# Patient Record
Sex: Male | Born: 1965 | Race: Black or African American | Hispanic: No | Marital: Married | Smoking: Never smoker
Health system: Southern US, Community
[De-identification: ages and names within clinical notes are randomized; demographics above are authoritative.]

## PROBLEM LIST (undated history)

## (undated) DIAGNOSIS — I2510 Atherosclerotic heart disease of native coronary artery without angina pectoris: Secondary | ICD-10-CM

## (undated) DIAGNOSIS — J449 Chronic obstructive pulmonary disease, unspecified: Secondary | ICD-10-CM

## (undated) DIAGNOSIS — I10 Essential (primary) hypertension: Secondary | ICD-10-CM

## (undated) DIAGNOSIS — E119 Type 2 diabetes mellitus without complications: Secondary | ICD-10-CM

## (undated) HISTORY — PX: BACK SURGERY: SHX140

## (undated) HISTORY — PX: NECK SURGERY: SHX720

---

## 2015-05-09 ENCOUNTER — Encounter (HOSPITAL_BASED_OUTPATIENT_CLINIC_OR_DEPARTMENT_OTHER): Payer: Self-pay

## 2015-05-09 ENCOUNTER — Emergency Department (HOSPITAL_BASED_OUTPATIENT_CLINIC_OR_DEPARTMENT_OTHER)
Admission: EM | Admit: 2015-05-09 | Discharge: 2015-05-10 | Disposition: A | Discharge status: Home / Self Care | Payer: Self-pay | Attending: Emergency Medicine | Admitting: Emergency Medicine

## 2015-05-09 ENCOUNTER — Emergency Department (HOSPITAL_BASED_OUTPATIENT_CLINIC_OR_DEPARTMENT_OTHER): Payer: Self-pay

## 2015-05-09 DIAGNOSIS — Z79899 Other long term (current) drug therapy: Secondary | ICD-10-CM | POA: Insufficient documentation

## 2015-05-09 DIAGNOSIS — Z7982 Long term (current) use of aspirin: Secondary | ICD-10-CM | POA: Insufficient documentation

## 2015-05-09 DIAGNOSIS — Z794 Long term (current) use of insulin: Secondary | ICD-10-CM | POA: Insufficient documentation

## 2015-05-09 DIAGNOSIS — I10 Essential (primary) hypertension: Secondary | ICD-10-CM | POA: Insufficient documentation

## 2015-05-09 DIAGNOSIS — R739 Hyperglycemia, unspecified: Secondary | ICD-10-CM

## 2015-05-09 DIAGNOSIS — M545 Low back pain: Secondary | ICD-10-CM | POA: Insufficient documentation

## 2015-05-09 DIAGNOSIS — M542 Cervicalgia: Secondary | ICD-10-CM | POA: Insufficient documentation

## 2015-05-09 DIAGNOSIS — E1165 Type 2 diabetes mellitus with hyperglycemia: Secondary | ICD-10-CM | POA: Insufficient documentation

## 2015-05-09 DIAGNOSIS — J449 Chronic obstructive pulmonary disease, unspecified: Secondary | ICD-10-CM | POA: Insufficient documentation

## 2015-05-09 DIAGNOSIS — I2510 Atherosclerotic heart disease of native coronary artery without angina pectoris: Secondary | ICD-10-CM | POA: Insufficient documentation

## 2015-05-09 DIAGNOSIS — R079 Chest pain, unspecified: Secondary | ICD-10-CM

## 2015-05-09 HISTORY — DX: Chronic obstructive pulmonary disease, unspecified: J44.9

## 2015-05-09 HISTORY — DX: Atherosclerotic heart disease of native coronary artery without angina pectoris: I25.10

## 2015-05-09 HISTORY — DX: Type 2 diabetes mellitus without complications: E11.9

## 2015-05-09 HISTORY — DX: Essential (primary) hypertension: I10

## 2015-05-09 LAB — BASIC METABOLIC PANEL
ANION GAP: 9 (ref 5–15)
BUN: 9 mg/dL (ref 6–20)
CHLORIDE: 104 mmol/L (ref 101–111)
CO2: 25 mmol/L (ref 22–32)
Calcium: 8.6 mg/dL — ABNORMAL LOW (ref 8.9–10.3)
Creatinine, Ser: 0.78 mg/dL (ref 0.61–1.24)
GFR calc Af Amer: >60 mL/min (ref >60)
GLUCOSE: 157 mg/dL — AB (ref 65–99)
POTASSIUM: 3.5 mmol/L (ref 3.5–5.1)
SODIUM: 138 mmol/L (ref 135–145)

## 2015-05-09 LAB — URINALYSIS, ROUTINE W REFLEX MICROSCOPIC
Bilirubin Urine: NEGATIVE
GLUCOSE, UA: 100 mg/dL — AB
Hgb urine dipstick: NEGATIVE
KETONES UR: NEGATIVE mg/dL
LEUKOCYTES UA: NEGATIVE
NITRITE: NEGATIVE
PROTEIN: NEGATIVE mg/dL
Specific Gravity, Urine: 1.028 (ref 1.005–1.030)
pH: 6 (ref 5.0–8.0)

## 2015-05-09 LAB — CBC
HCT: 39.3 % (ref 39.0–52.0)
HEMOGLOBIN: 13.3 g/dL (ref 13.0–17.0)
MCH: 28 pg (ref 26.0–34.0)
MCHC: 33.8 g/dL (ref 30.0–36.0)
MCV: 82.7 fL (ref 78.0–100.0)
PLATELETS: 296 10*3/uL (ref 150–400)
RBC: 4.75 MIL/uL (ref 4.22–5.81)
RDW: 14 % (ref 11.5–15.5)
WBC: 7.9 10*3/uL (ref 4.0–10.5)

## 2015-05-09 LAB — TROPONIN I

## 2015-05-09 LAB — CBG MONITORING, ED: Glucose-Capillary: 201 mg/dL — ABNORMAL HIGH (ref 65–99)

## 2015-05-09 MED ORDER — MORPHINE SULFATE (PF) 4 MG/ML IV SOLN
4.0000 mg | Freq: Once | INTRAVENOUS | Status: AC
  Administered 2015-05-09: 4 mg via INTRAVENOUS
  Filled 2015-05-09: qty 1

## 2015-05-09 MED ORDER — SODIUM CHLORIDE 0.9 % IV BOLUS (SEPSIS)
1000.0000 mL | Freq: Once | INTRAVENOUS | Status: AC
  Administered 2015-05-09: 1000 mL via INTRAVENOUS

## 2015-05-09 NOTE — ED Notes (Signed)
Pt has multiple complaints ranging from H/A to neck pain. Has recently moved here and been out of his meds for over a month.

## 2015-05-09 NOTE — ED Provider Notes (Signed)
CSN: [id number]     Arrival date & time 05/09/15  1831 History   First MD Initiated Contact with Patient 05/09/15 2158     Chief Complaint  Patient presents with  . Multiple c/o    (Consider location/radiation/quality/duration/timing/severity/associated sxs/prior Treatment) HPI 50 y.o. male with a hx of HTN, DM, CAD, COPD, presents to the Emergency Department today complaining of headache, neck/back pain x 3 days. Pt notes that he has been unable to take any of his medications for the past month due to recent move from [place]. Has not been able to take BP meds or Insulin. Has PCP appointment on 13th for intake and medications. Has had back/neck pain x 3 das as well as headache. Notes vision changes with blurriness. No loss of vision. No numbness/tingling. No N/V/D.   Noted chest pain while in ED. States pain was pressure in central of chest. 7/10. No radiation. Went away after 5 minutes. No hx MI. Recent stress 2016 and unremarkable. Risk factors: DM, HTN, HLD, Smoking. FH on mother side with MI. No other symptoms noted.        Past Medical History  Diagnosis Date  . Diabetes mellitus without complication (HCC)   . Hypertension   . Coronary artery disease   . COPD (chronic obstructive pulmonary disease) [facility](HCC)    Past Surgical History  Procedure Laterality Date  . Back surgery    . Neck surgery     No family history on file. Social History  Substance Use Topics  . Smoking status: Never Smoker   . Smokeless tobacco: None  . Alcohol Use: No    Review of Systems ROS reviewed and all are negative for acute change except as noted in the HPI.  Allergies  Lisinopril and Prednisone  Home Medications   Prior to Admission medications   Medication Sig Start Date End Date Taking? Authorizing Provider  albuterol (PROVENTIL HFA;VENTOLIN HFA) 108 (90 Base) MCG/ACT inhaler Inhale into the lungs every 6 (six) hours as needed for wheezing or shortness of breath.    Historical Provider, MD   ALPRAZolam Prudy Feeler(XANAX) 0.5 MG tablet Take 0.5 mg by mouth at bedtime as needed for anxiety.    Historical Provider, MD  amLODipine (NORVASC) 10 MG tablet Take 10 mg by mouth daily.    Historical Provider, MD  aspirin EC 81 MG tablet Take 81 mg by mouth daily.    Historical Provider, MD  atorvastatin (LIPITOR) 20 MG tablet Take 20 mg by mouth daily.    Historical Provider, MD  carvedilol (COREG) 25 MG tablet Take 25 mg by mouth 2 (two) times daily with a meal.    Historical Provider, MD  cyclobenzaprine (FLEXERIL) 10 MG tablet Take 10 mg by mouth 3 (three) times daily as needed for muscle spasms.    Historical Provider, MD  doxazosin (CARDURA) 8 MG tablet Take 8 mg by mouth daily.    Historical Provider, MD  gabapentin (NEURONTIN) 300 MG capsule Take 300 mg by mouth 3 (three) times daily.    Historical Provider, MD  hydrALAZINE (APRESOLINE) 25 MG tablet Take 25 mg by mouth 3 (three) times daily.    Historical Provider, MD  insulin aspart (NOVOLOG) 100 UNIT/ML injection Inject 18 Units into the skin 3 (three) times daily before meals.    Historical Provider, MD  insulin detemir (LEVEMIR) 100 UNIT/ML injection Inject 70 Units into the skin 2 (two) times daily.    Historical Provider, MD  ipratropium (ATROVENT HFA) 17 MCG/ACT inhaler Inhale 2  puffs into the lungs every 6 (six) hours.    Historical Provider, MD  losartan (COZAAR) 100 MG tablet Take 100 mg by mouth daily.    Historical Provider, MD  nitroGLYCERIN (NITROSTAT) 0.4 MG SL tablet Place 0.4 mg under the tongue every 5 (five) minutes as needed for chest pain.    Historical Provider, MD  oxyCODONE-acetaminophen (PERCOCET) 7.5-325 MG tablet Take 1 tablet by mouth every 4 (four) hours as needed for severe pain.    Historical Provider, MD  pantoprazole (PROTONIX) 40 MG tablet Take 40 mg by mouth daily.    Historical Provider, MD  risperiDONE (RISPERDAL) 0.25 MG tablet Take 0.25 mg by mouth at bedtime.    Historical Provider, MD  valACYclovir (VALTREX)  1000 MG tablet Take 1,000 mg by mouth 2 (two) times daily.    Historical Provider, MD   BP 163/113 mmHg  Pulse 64  Temp(Src) 98.2 F (36.8 C) (Oral)  Resp 18  Ht  (1.753 m)  Wt 92.534 kg  BMI 30.11 kg/m2  SpO2 98%   Physical Exam  Constitutional: He is oriented to person, place, and time. He appears well-developed and well-nourished.  HENT:  Head: Normocephalic and atraumatic.  Eyes: EOM are normal. Pupils are equal, round, and reactive to light.  Neck: Normal range of motion. Neck supple. No tracheal deviation present.  Cardiovascular: Normal rate, regular rhythm, S1 normal, S2 normal, normal heart sounds, intact distal pulses and normal pulses.   No murmur heard. Pulmonary/Chest: Effort normal and breath sounds normal. No respiratory distress. He has no wheezes. He has no rales.  Abdominal: Soft. There is no tenderness.  Musculoskeletal: Normal range of motion.  Neurological: He is alert and oriented to person, place, and time.  Skin: Skin is warm and dry.  Psychiatric: He has a normal mood and affect. His behavior is normal. Thought content normal.  Nursing note and vitals reviewed.  ED Course  Procedures (including critical care time) Labs Review Labs Reviewed  BASIC METABOLIC PANEL - Abnormal; Notable for the following:    Glucose, Bld 157 (*)    Calcium 8.6 (*)    All other components within normal limits  URINALYSIS, ROUTINE W REFLEX MICROSCOPIC (NOT AT [facility]) - Abnormal; Notable for the following:    Color, Urine AMBER (*)    Glucose, UA 100 (*)    All other components within normal limits  CBG MONITORING, ED - Abnormal; Notable for the following:    Glucose-Capillary 201 (*)    All other components within normal limits  CBC  TROPONIN I  TROPONIN I   Imaging Review Dg Chest 2 View  05/09/2015  CLINICAL DATA:  Initial evaluation for acute chest pain. EXAM: CHEST  2 VIEW COMPARISON:  None. FINDINGS: The cardiac and mediastinal silhouettes are within normal  limits. Tracheal air column midline and patent. Lungs are normally inflated. No focal infiltrate, pulmonary edema, or pleural effusion. No pneumothorax. ACDF overlies of lower cervical spine.  No acute osseus abnormality. IMPRESSION: No active cardiopulmonary disease. Electronically Signed   By: Rise Mu M.D.   On: 05/09/2015 23:14   I have personally reviewed and evaluated these images and lab results as part of my medical decision-making.   EKG Interpretation   Date/Time:  Tuesday May 09 2015 22:24:43 EST Ventricular Rate:  70 PR Interval:  154 QRS Duration: 94 QT Interval:  400 QTC Calculation: 432 R Axis:   75 Text Interpretation:  Normal sinus rhythm with sinus arrhythmia Abnormal  QRS-T  angle, consider primary T wave abnormality Abnormal ECG T wave  inversion III, no previous ekg available Confirmed by LITTLE MD, RACHEL  [phone]) on 05/09/2015 11:29:14 PM      MDM  I have reviewed and evaluated the relevant laboratory values.I have reviewed and evaluated the relevant imaging studies.I personally evaluated and interpreted the relevant EKG.I have reviewed the relevant previous healthcare records.I have reviewed EMS Documentation.I obtained HPI from historian. Patient discussed with supervising physician  ED Course:  Assessment: 49y hx of HTN, DM, CAD, COPD presents with neck/back pain as well as chest pain in ED. Risk Factors DM, HTN, HLD. Neg stress 2016. Given Morphine in ED due to back pain and hypertension. After administration, BP improved. Chest pain is not likely of cardiac or pulmonary etiology d/t presentation, perc negative, VSS, no tracheal deviation, no JVD or new murmur, RRR, breath sounds equal bilaterally, EKG without acute abnormalities, negative troponin, and negative CXR. Pain lasted <5 min. No radiation. Heart Score 3. Pt has been without HTN meds and DM meds x 1 month. Given 10 day Rx of medication until PCP appointment. Left message with Case Management  at [facility] for assistance with Insulin medication due to cost.   Advised to return to ED if CP becomes exertional, associated with diaphoresis or nausea, radiates to left jaw/arm, worsens or becomes concerning in any way. Pt appears reliable for follow up and is agreeable to discharge. Patient is in no acute distress. Vital Signs are stable. Patient is able to ambulate. Patient able to tolerate PO.    Disposition/Plan:  DC Home if second Troponin negative Additional Verbal discharge instructions given and discussed with patient.  Pt Instructed to f/u with PCP at scheduled appointment  Return precautions given Pt acknowledges and agrees with plan  Supervising Physician Laurence Spates, MD   Final diagnoses:  Chest pain, unspecified chest pain type  Essential hypertension  Hyperglycemia      Audry Pili, PA-C 05/10/15 0056  Laurence Spates, MD 05/12/15 [phone]

## 2015-05-09 NOTE — ED Notes (Signed)
C/o HA, neck and back pain, BS and HTN elevated-out of meds x 1 month-pt with steady gait-NAD

## 2015-05-10 ENCOUNTER — Telehealth: Payer: Self-pay | Admitting: *Deleted

## 2015-05-10 LAB — TROPONIN I

## 2015-05-10 MED ORDER — LOSARTAN POTASSIUM 100 MG PO TABS: 100.0000 mg | ORAL_TABLET | Freq: Every day | ORAL | Status: AC

## 2015-05-10 MED ORDER — AMLODIPINE BESYLATE 10 MG PO TABS: 10.0000 mg | ORAL_TABLET | Freq: Every day | ORAL | Status: AC

## 2015-05-10 MED ORDER — INSULIN DETEMIR 100 UNIT/ML ~~LOC~~ SOLN: 70.0000 [IU] | Freq: Two times a day (BID) | SUBCUTANEOUS | Status: AC

## 2015-05-10 MED ORDER — GLUCOSE BLOOD VI STRP: ORAL_STRIP | Status: AC

## 2015-05-10 MED ORDER — ASPIRIN EC 81 MG PO TBEC: 81.0000 mg | DELAYED_RELEASE_TABLET | Freq: Every day | ORAL | Status: AC

## 2015-05-10 MED ORDER — INSULIN ASPART 100 UNIT/ML ~~LOC~~ SOLN: 18.0000 [IU] | Freq: Three times a day (TID) | SUBCUTANEOUS | Status: AC

## 2015-05-10 MED ORDER — CARVEDILOL 25 MG PO TABS: 25.0000 mg | ORAL_TABLET | Freq: Two times a day (BID) | ORAL | Status: AC

## 2015-05-10 NOTE — Telephone Encounter (Signed)
ERCM placed call to number listed- no answer; left message to have pt call back pertaining to no insurance/pcp.

## 2015-05-10 NOTE — ED Notes (Signed)
PA at bedside.

## 2015-05-10 NOTE — ED Notes (Signed)
Pt given d/c instructions as per chart. Verbalizes understanding. No questions. Rx x 7

## 2015-05-10 NOTE — Discharge Instructions (Signed)
Please read and follow all provided instructions.  Your diagnoses today include:  1. Chest pain, unspecified chest pain type   2. Essential hypertension   3. Hyperglycemia    Tests performed today include:  An EKG of your heart  A chest x-ray  Cardiac enzymes - a blood test for heart muscle damage  Blood counts and electrolytes  Vital signs. See below for your results today.   Medications prescribed:   Take any prescribed medications only as directed.  Follow-up instructions: Please follow-up with your primary care provider at your scheduled appointment.  I called and spoke with a Case Manager at [facility]. They should call you soon with assistance for Insulin Prescription      Return instructions:  SEEK IMMEDIATE MEDICAL ATTENTION IF:  You have severe chest pain, especially if the pain is crushing or pressure-like and spreads to the arms, back, neck, or jaw, or if you have sweating, nausea (feeling sick to your stomach), or shortness of breath. THIS IS AN EMERGENCY. Don't wait to see if the pain will go away. Get medical help at once. Call 911 or [phone] (operator). DO NOT drive yourself to the hospital.   Your chest pain gets worse and does not go away with rest.   You have an attack of chest pain lasting longer than usual, despite rest and treatment with the medications your caregiver has prescribed.   You wake from sleep with chest pain or shortness of breath.  You feel dizzy or faint.  You have chest pain not typical of your usual pain for which you originally saw your caregiver.   You have any other emergent concerns regarding your health.  Additional Information: Chest pain comes from many different causes. Your caregiver has diagnosed you as having chest pain that is not specific for one problem, but does not require admission.  You are at low risk for an acute heart condition or other serious illness.   Your vital signs today were: BP 156/90 mmHg   Pulse 70    Temp(Src) 98.4 F (36.9 C) (Oral)   Resp 18   Ht 5\' 9"  (1.753 m)   Wt 92.534 kg   BMI 30.11 kg/m2   SpO2 98% If your blood pressure (BP) was elevated above 135/85 this visit, please have this repeated by your doctor within one month. --------------

## 2015-05-10 NOTE — ED Notes (Signed)
Patient given a microwave meal and Dt. Coke.

## 2016-07-23 IMAGING — DX DG CHEST 2V
2 series · 2 of 2 positions shown · non-contrast
Comparison: None.

CLINICAL DATA: Initial evaluation for acute chest pain.

EXAM:
CHEST  2 VIEW

[chest pa]
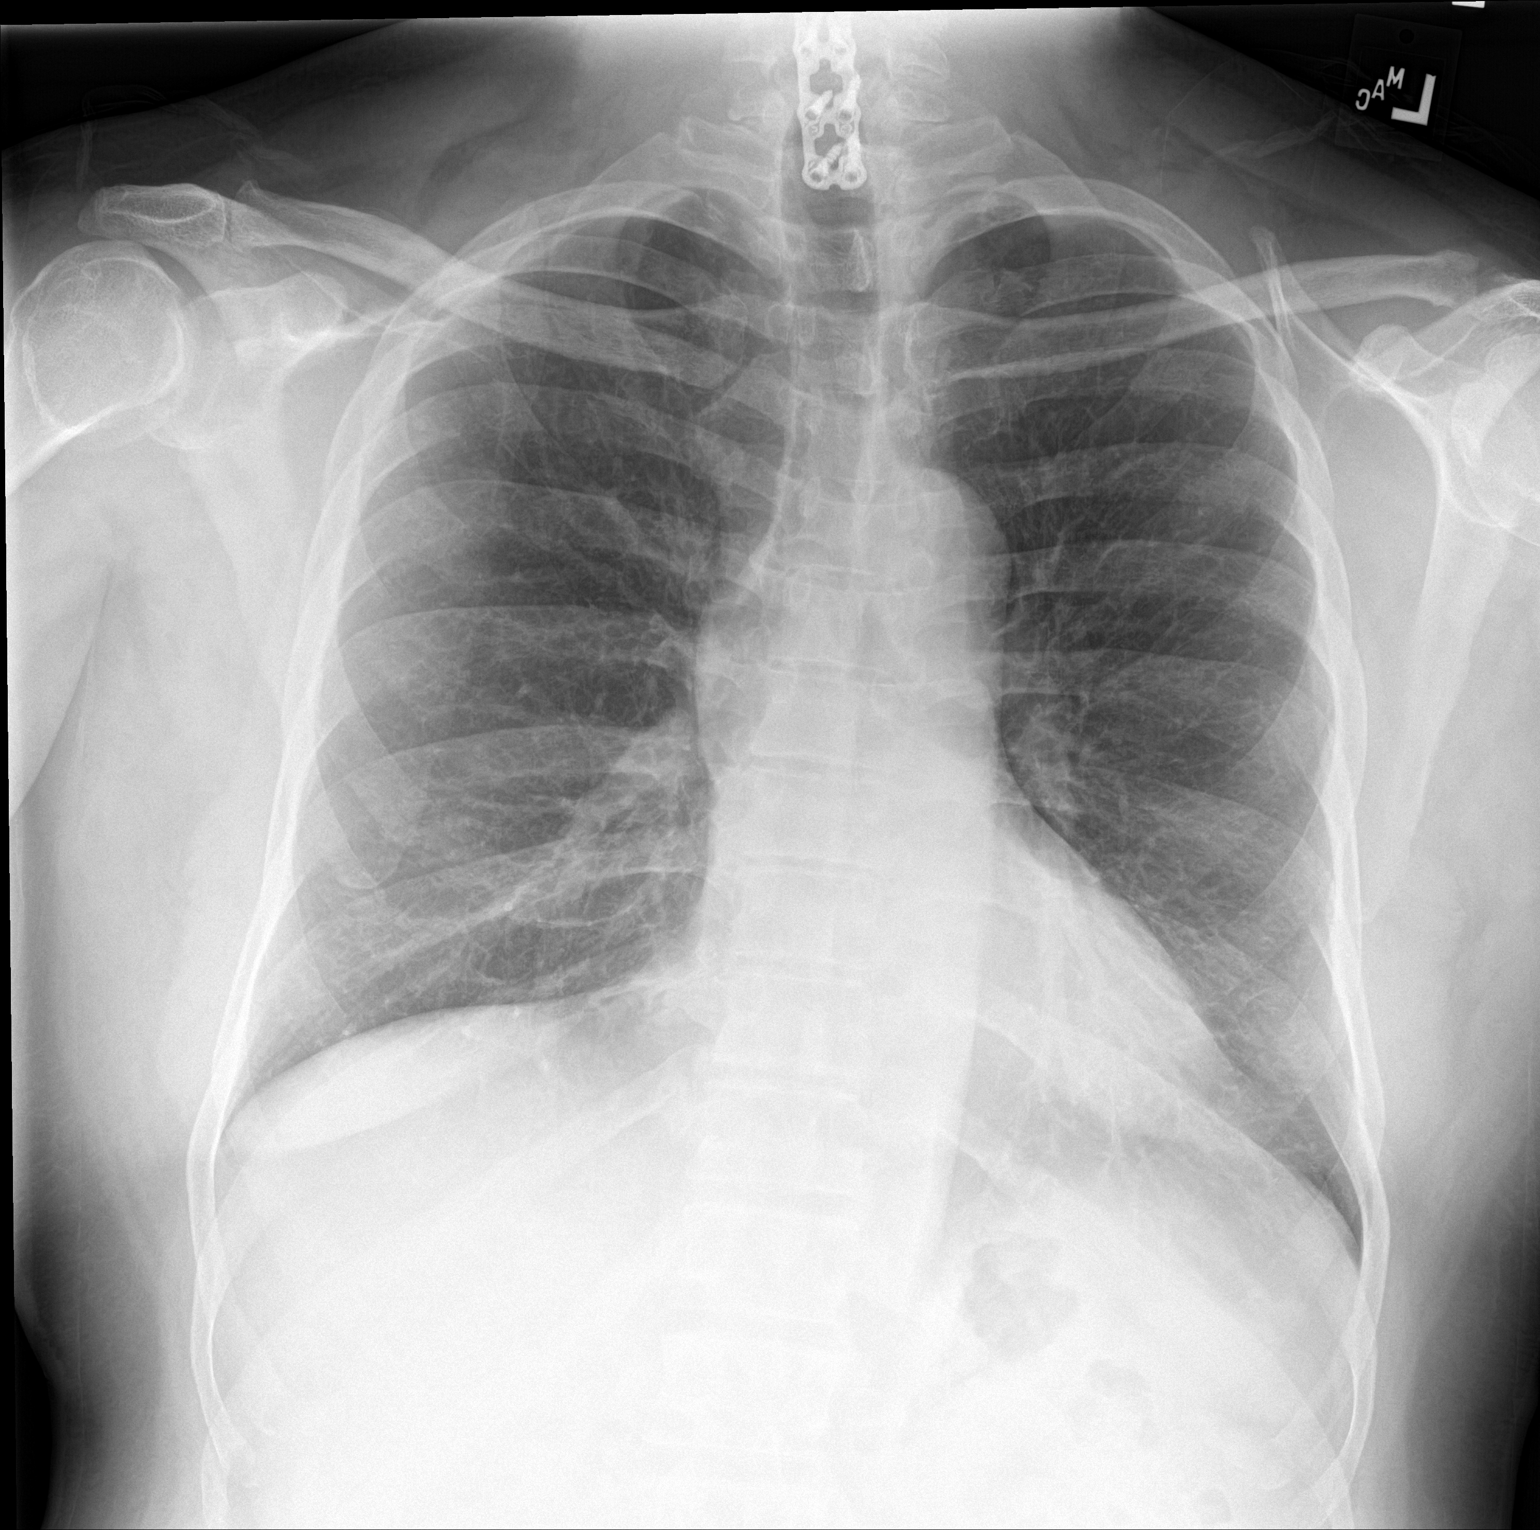

[chest lat]
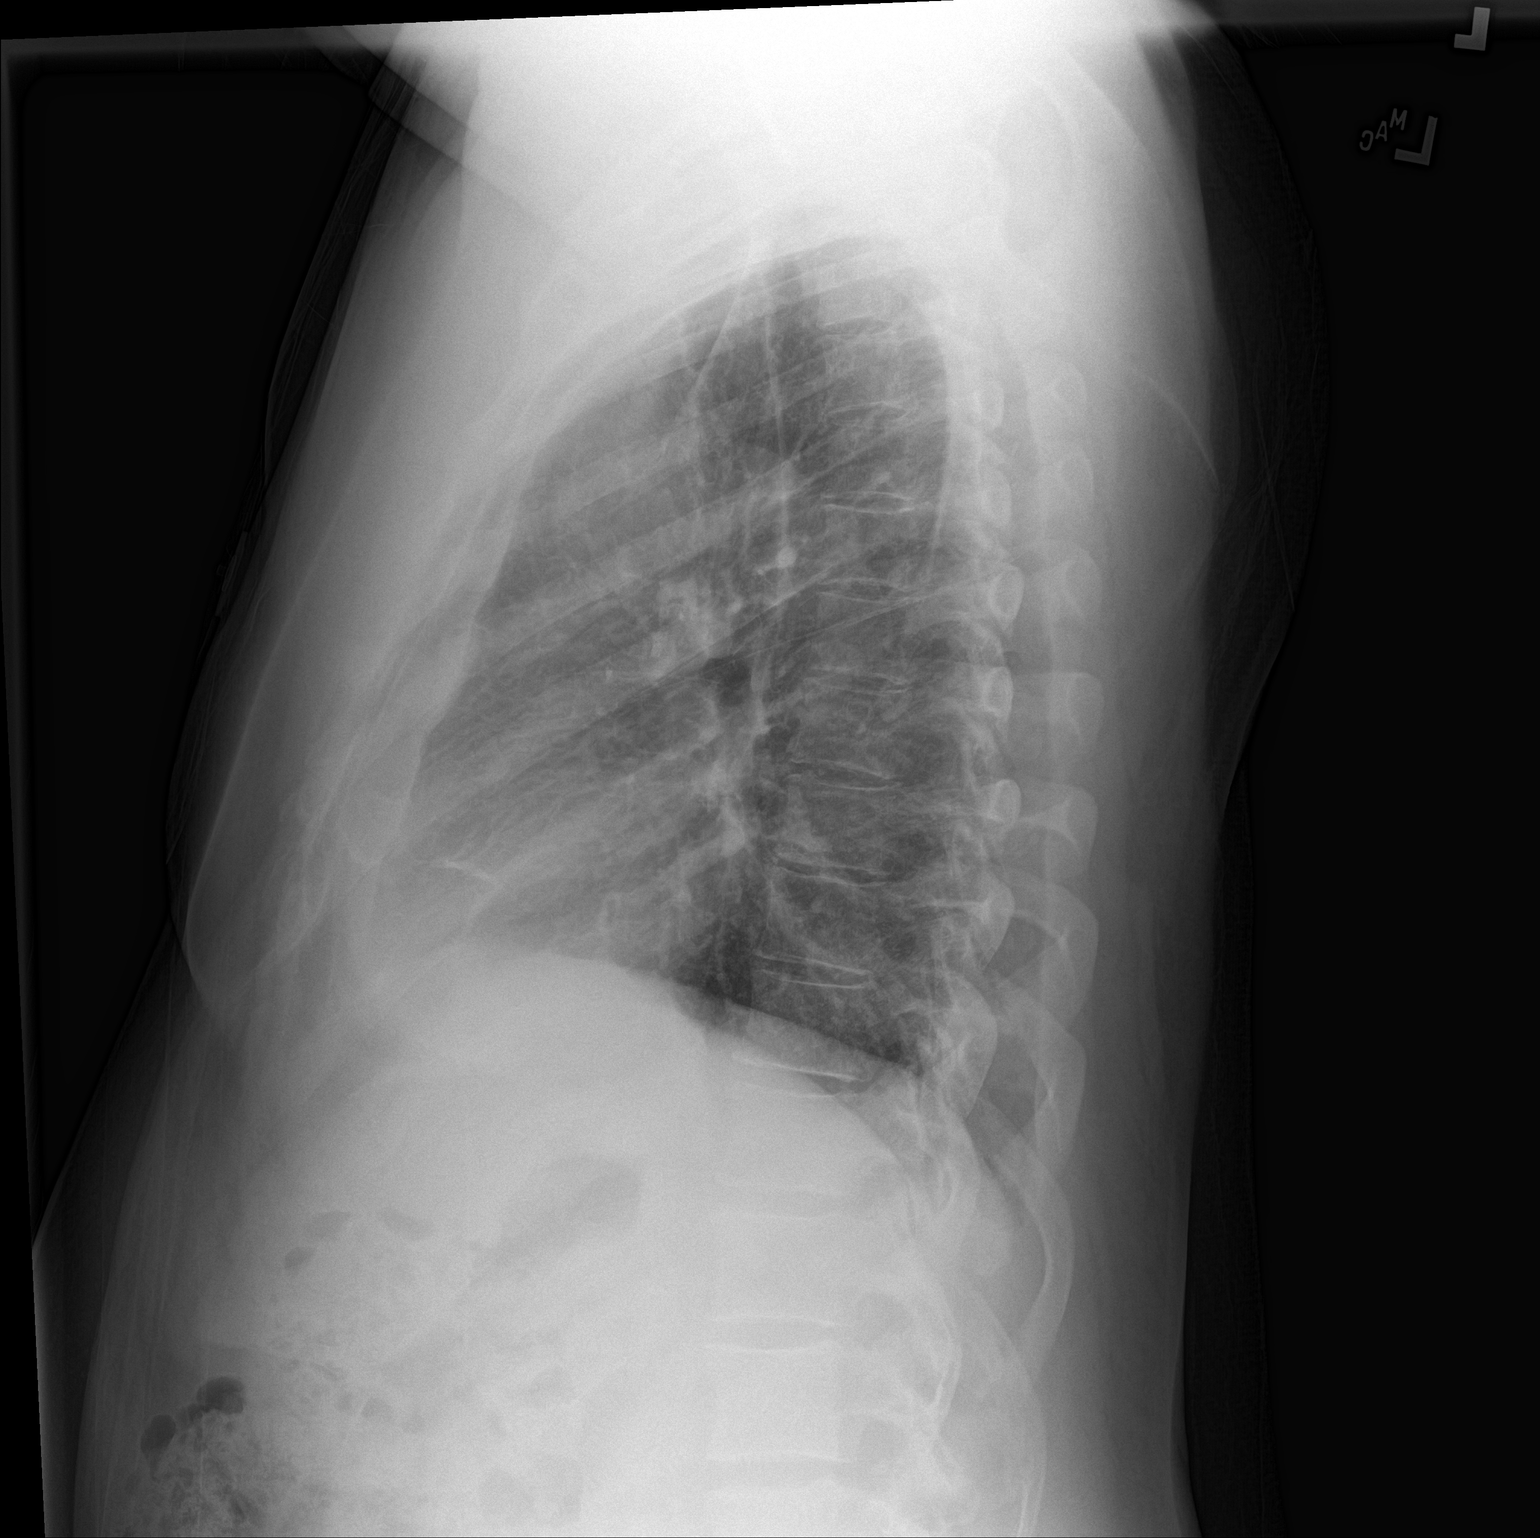

[2 of 2 positions shown; findings below may reference images not displayed]

FINDINGS: The cardiac and mediastinal silhouettes are within normal limits.
Tracheal air column midline and patent.

Lungs are normally inflated. No focal infiltrate, pulmonary edema,
or pleural effusion. No pneumothorax.

ACDF overlies of lower cervical spine.  No acute osseus abnormality.
IMPRESSION: No active cardiopulmonary disease.
